# Patient Record
Sex: Female | Born: 1960 | Race: Black or African American | Hispanic: No | Marital: Single
Health system: Southern US, Community
[De-identification: ages and names within clinical notes are randomized; demographics above are authoritative.]

---

## 2005-06-01 ENCOUNTER — Encounter: Admission: RE | Admit: 2005-06-01 | Discharge: 2005-06-01 | Payer: Self-pay | Admitting: Internal Medicine

## 2011-06-29 ENCOUNTER — Emergency Department: Payer: Self-pay | Admitting: Emergency Medicine

## 2011-06-29 LAB — COMPREHENSIVE METABOLIC PANEL
Albumin: 3.7 g/dL (ref 3.4–5.0)
Alkaline Phosphatase: 67 U/L (ref 50–136)
Anion Gap: 11 (ref 7–16)
Bilirubin,Total: 0.2 mg/dL (ref 0.2–1.0)
Calcium, Total: 8.8 mg/dL (ref 8.5–10.1)
Co2: 31 mmol/L (ref 21–32)
Creatinine: 1.26 mg/dL (ref 0.60–1.30)
Glucose: 79 mg/dL (ref 65–99)
Osmolality: 275 (ref 275–301)
Potassium: 3.2 mmol/L — ABNORMAL LOW (ref 3.5–5.1)
Sodium: 138 mmol/L (ref 136–145)

## 2011-06-29 LAB — CBC WITH DIFFERENTIAL/PLATELET
Basophil #: 0 10*3/uL (ref 0.0–0.1)
HCT: 40.6 % (ref 35.0–47.0)
Lymphocyte #: 1.3 10*3/uL (ref 1.0–3.6)
MCH: 28.6 pg (ref 26.0–34.0)
MCHC: 32.3 g/dL (ref 32.0–36.0)
MCV: 88 fL (ref 80–100)
Monocyte %: 6.4 %
Neutrophil #: 2.3 10*3/uL (ref 1.4–6.5)
RDW: 14.6 % — ABNORMAL HIGH (ref 11.5–14.5)
WBC: 3.9 10*3/uL (ref 3.6–11.0)

## 2011-06-29 LAB — RAPID INFLUENZA A&B ANTIGENS

## 2011-07-05 LAB — CULTURE, BLOOD (SINGLE)

## 2011-07-11 ENCOUNTER — Ambulatory Visit: Payer: Self-pay | Admitting: Internal Medicine

## 2011-07-11 LAB — COMPREHENSIVE METABOLIC PANEL
Anion Gap: 6 — ABNORMAL LOW (ref 7–16)
BUN: 12 mg/dL (ref 7–18)
Calcium, Total: 8.6 mg/dL (ref 8.5–10.1)
Chloride: 101 mmol/L (ref 98–107)
Co2: 33 mmol/L — ABNORMAL HIGH (ref 21–32)
EGFR (African American): >60
EGFR (Non-African Amer.): >60
Glucose: 103 mg/dL — ABNORMAL HIGH (ref 65–99)
Potassium: 3 mmol/L — ABNORMAL LOW (ref 3.5–5.1)
SGOT(AST): 22 U/L (ref 15–37)
SGPT (ALT): 28 U/L

## 2011-07-11 LAB — CBC CANCER CENTER
Basophil #: 0 x10 3/mm (ref 0.0–0.1)
Eosinophil #: 0 x10 3/mm (ref 0.0–0.7)
HGB: 12.7 g/dL (ref 12.0–16.0)
Lymphocyte %: 33.5 %
Neutrophil %: 59.6 %
Platelet: 271 x10 3/mm (ref 150–440)
RBC: 4.41 10*6/uL (ref 3.80–5.20)
RDW: 14.1 % (ref 11.5–14.5)
WBC: 5.4 x10 3/mm (ref 3.6–11.0)

## 2011-07-11 LAB — APTT: Activated PTT: 27 secs (ref 23.6–35.9)

## 2011-07-11 LAB — PROTIME-INR: Prothrombin Time: 12.1 secs (ref 11.5–14.7)

## 2011-07-12 LAB — CANCER ANTIGEN 27.29: CA 27.29: 28.3 U/mL (ref 0.0–38.6)

## 2011-07-30 ENCOUNTER — Ambulatory Visit: Payer: Self-pay | Admitting: Internal Medicine

## 2011-09-10 ENCOUNTER — Ambulatory Visit: Payer: Self-pay | Admitting: Gynecologic Oncology

## 2012-08-12 ENCOUNTER — Ambulatory Visit (HOSPITAL_COMMUNITY): Payer: Self-pay | Admitting: Physical Therapy

## 2013-12-10 IMAGING — CR DG CHEST 2V
1 series · 2 of 2 positions shown · non-contrast
Comparison: none

REASON FOR EXAM: cough
COMMENTS:

[Series 1: w chest pa · 0.14mm/px · 2 of 2 slices shown]
[im 1/2]
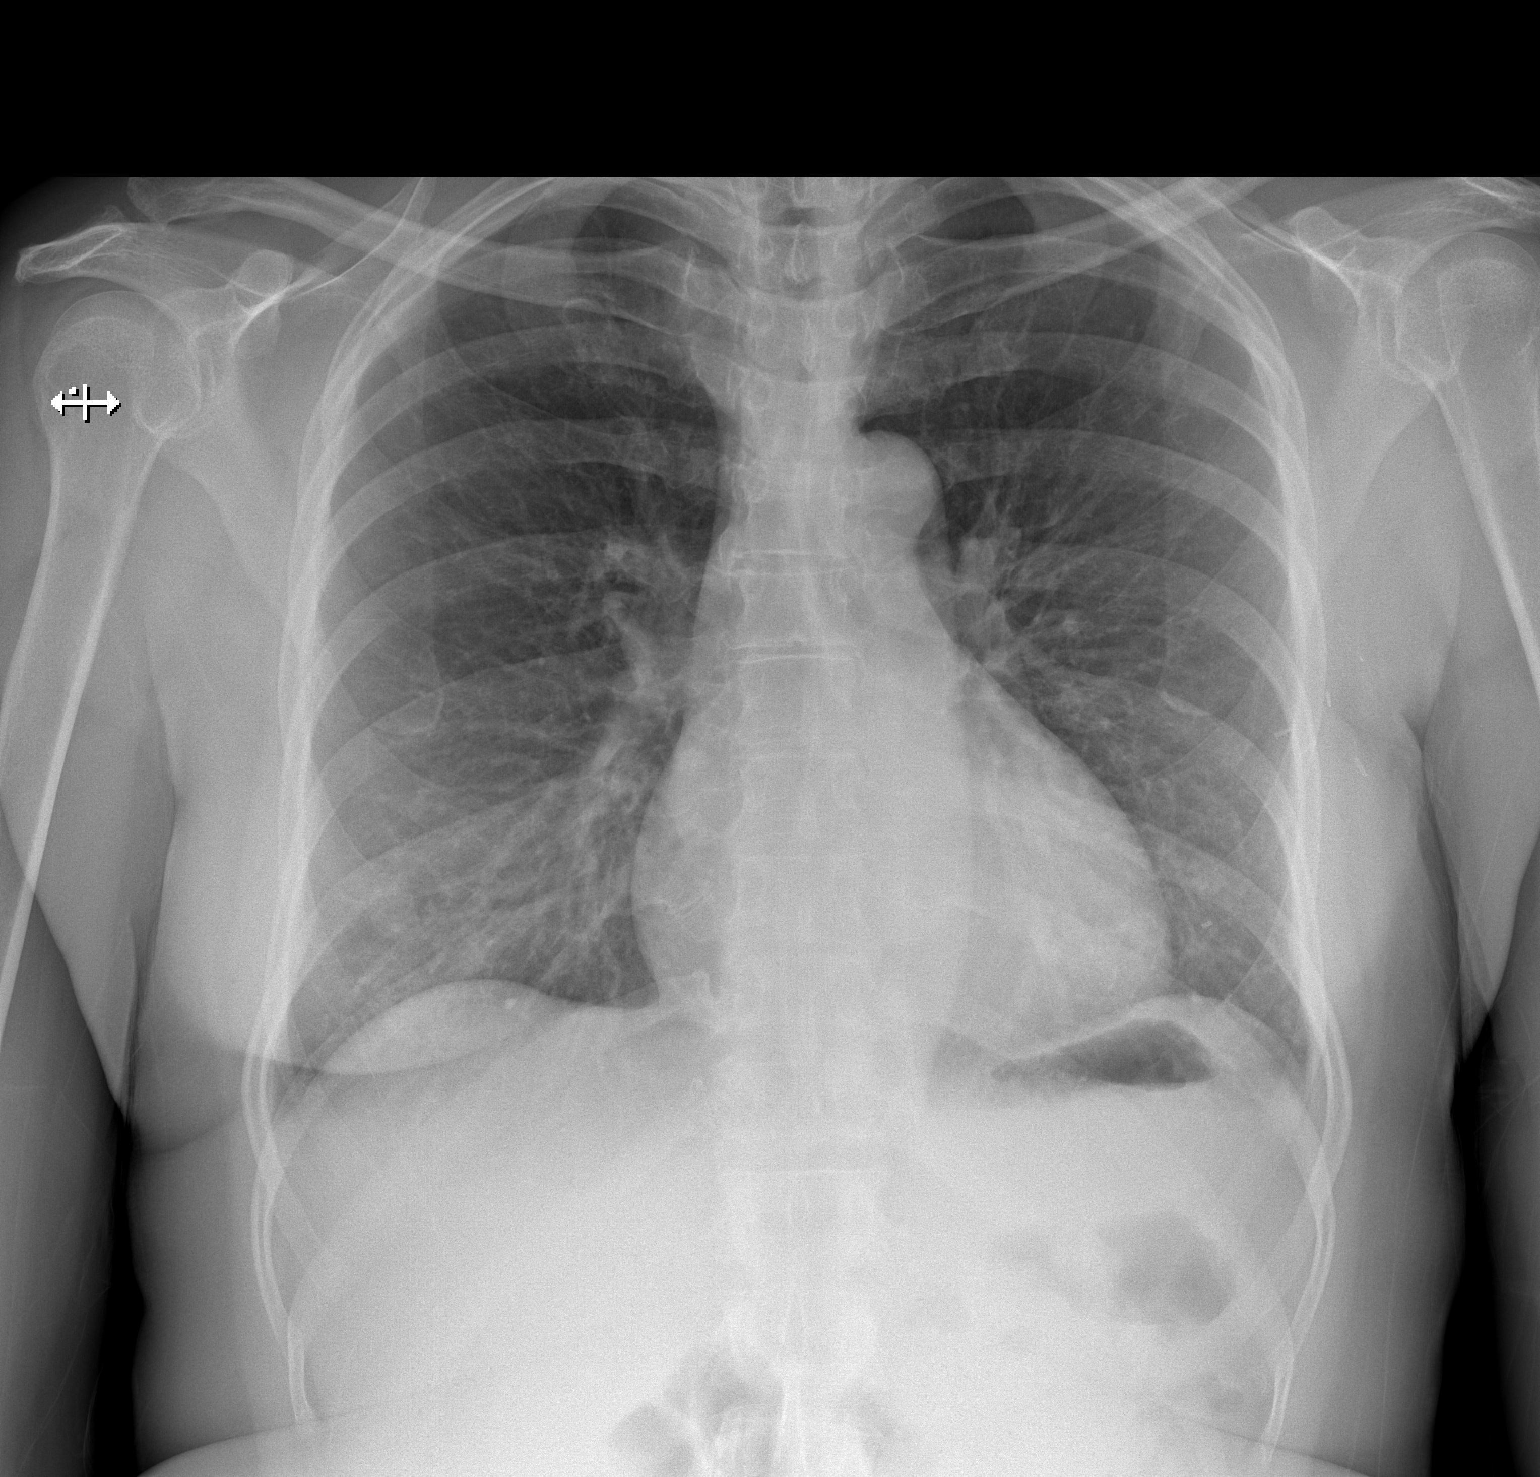
[im 2/2]
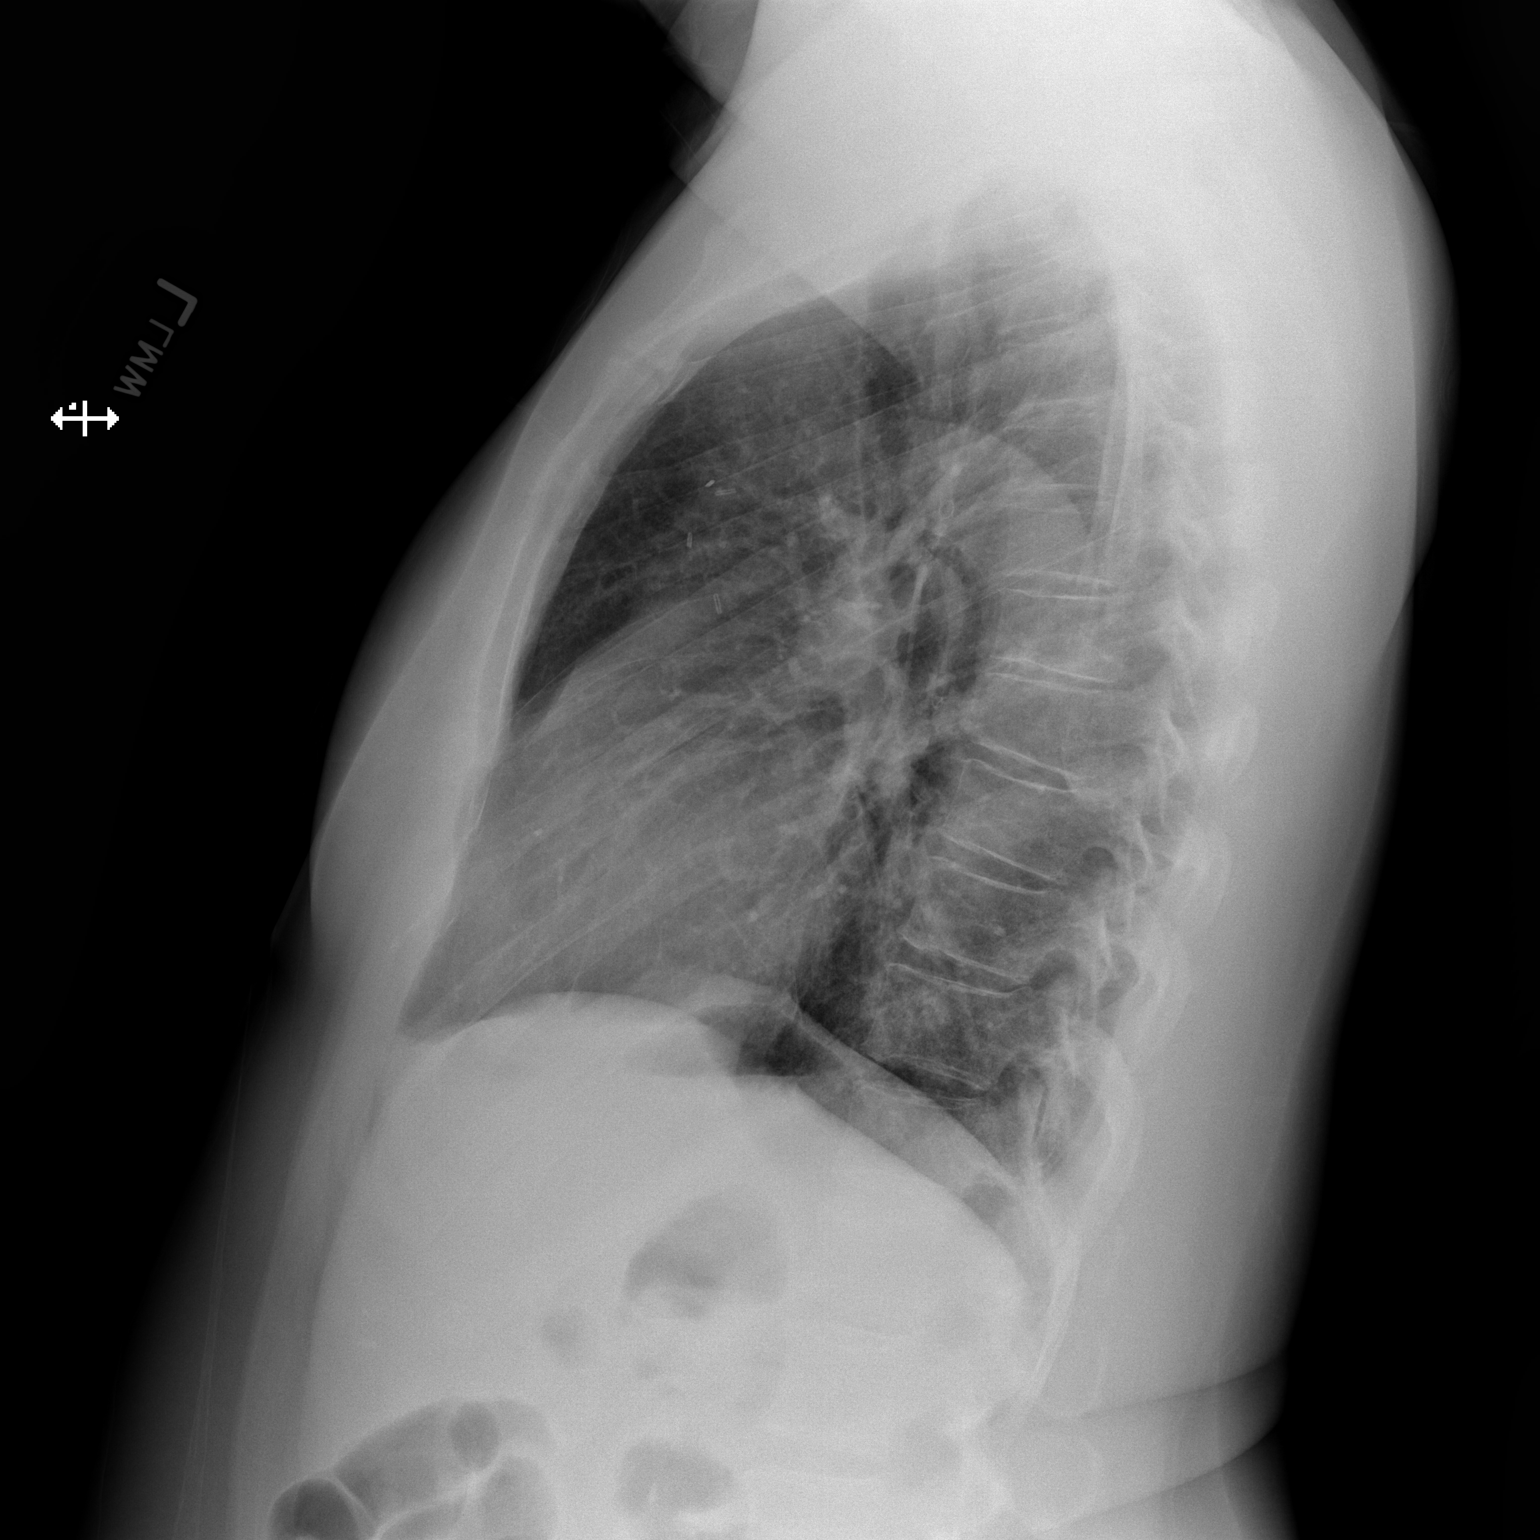

[2 of 2 positions shown; findings below may reference images not displayed]

PROCEDURE:     DXR - DXR CHEST PA (OR AP) AND LATERAL  - June 30, 2011 [DATE]

RESULT:

Questionable nodular density noted in the right lung base. This is most
likely a nipple shadow. This is not seen on lateral view. Follow-up PA and
lateral chest x-ray with and without nipple markers can be obtained as
needed. No evidence of infiltrate. The cardiovascular structures are
unremarkable. Surgical clips are noted in the upper chest.
IMPRESSION: 1.     No acute cardiopulmonary disease. Nodular density noted in the right
lung base. This is most likely a nipple shadow as described above. Follow-up
studies can be obtained to demonstrate this is a nipple shadow.
2.     Incidental note is made of surgical clips in the upper chest, seen on
the lateral view.
3.     Again, no acute cardiopulmonary disease noted.

## 2022-07-24 ENCOUNTER — Ambulatory Visit: Payer: Medicaid Other

## 2022-07-31 ENCOUNTER — Ambulatory Visit: Payer: Medicaid Other

## 2022-08-02 ENCOUNTER — Ambulatory Visit: Payer: Medicaid Other

## 2022-08-06 ENCOUNTER — Ambulatory Visit: Payer: Medicaid Other | Attending: Physician Assistant

## 2022-09-18 ENCOUNTER — Telehealth: Payer: Self-pay | Admitting: Physical Therapy

## 2022-09-18 ENCOUNTER — Ambulatory Visit: Payer: Medicaid Other | Admitting: Physical Therapy

## 2022-09-18 NOTE — Telephone Encounter (Signed)
Called pt to inquire about absence from initial eval. Did not reach so left VM instructing pt to call back to reschedule apt.

## 2022-09-26 ENCOUNTER — Ambulatory Visit: Payer: Medicaid Other | Attending: Physician Assistant | Admitting: Physical Therapy

## 2022-09-26 ENCOUNTER — Encounter: Payer: Medicaid Other | Admitting: Physical Therapy

## 2022-10-01 ENCOUNTER — Encounter: Payer: Medicaid Other | Admitting: Physical Therapy

## 2022-10-03 ENCOUNTER — Encounter: Payer: Medicaid Other | Admitting: Physical Therapy

## 2022-10-08 ENCOUNTER — Encounter: Payer: Medicaid Other | Admitting: Physical Therapy

## 2022-10-10 ENCOUNTER — Encounter: Payer: Medicaid Other | Admitting: Physical Therapy
